# Patient Record
Sex: Female | Born: 1980 | Race: White | Hispanic: No | Marital: Single | Smoking: Current every day smoker
Health system: Southern US, Community
[De-identification: ages and names within clinical notes are randomized; demographics above are authoritative.]

---
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
## 2017-01-27 ENCOUNTER — Ambulatory Visit: Payer: BLUE CROSS/BLUE SHIELD | Admitting: Internal Medicine

## 2017-01-27 ENCOUNTER — Encounter: Payer: Self-pay | Admitting: Internal Medicine

## 2017-01-27 DIAGNOSIS — B182 Chronic viral hepatitis C: Secondary | ICD-10-CM

## 2017-01-27 NOTE — Progress Notes (Signed)
[facility]   CC: consideration for treatment for chronic hepatitis C  HPI:  +Kaitlin Walton is a 36 y.o. female who presents for initial evaluation and management of chronic hepatitis C.  Patient tested positive several years ago and was evaluated for treatment in [facility] but denied by insurance at the time. Hepatitis C-associated risk factors present are: IV drug abuse (details: last used 4 years ago). Patient denies history of blood transfusion, intranasal drug use. Patient has not had other studies performed. Results: none. Patient has not had prior treatment for Hepatitis C. Patient does not have a past history of liver disease. Patient does have a family history of liver disease. Patient does not  have associated signs or symptoms related to liver disease.     Patient does not have documented immunity to Hepatitis A. Patient does not have documented immunity to Hepatitis B.    Review of Systems:  Constitutional: negative for fatigue and malaise Gastrointestinal: negative for diarrhea Integument/breast: negative for rash All other systems reviewed and are negative       No past medical history on file.  Prior to Admission medications   Medication Sig Start Date End Date Taking? Authorizing Provider  [facility] NICOTINE 21 MG/24HR patch APPLY 1 PATCH DAILY AS DIRECTED. 12/20/16   [provider]    Not on File  Social History   Tobacco Use  . Smoking status: Current Every Day Smoker    Packs/day: 0.50    Types: Cigarettes  . Smokeless tobacco: Never Used  Substance Use Topics  . Alcohol use: No    Frequency: Never  . Drug use: No    FMH: grandfather with alcoholic cirrhosis   Objective:  Constitutional: in no apparent distress and alert,  Vitals:   01/27/17 0907  BP: 117/71  Pulse: 60  Temp: 98 F (36.7 C)   Eyes: anicteric Cardiovascular: Cor RRR Respiratory: CTA B; normal respiratory effort Gastrointestinal: Bowel sounds  are normal, liver is not enlarged, spleen is not enlarged Musculoskeletal: no pedal edema noted Skin: negatives: no rash; no porphyria cutanea tarda Lymphatic: no cervical lymphadenopathy   Laboratory Genotype: No results found for: HCVGENOTYPE HCV viral load: No results found for: HCVQUANT No results found for: WBC, HGB, HCT, MCV, PLT No results found for: CREATININE, BUN, NA, K, CL, CO2 No results found for: ALT, AST, GGT, ALKPHOS   Labs and history reviewed and show CHILD-PUGH unknown  5-6 points: Child class A 7-9 points: Child class B 10-15 points: Child class C  No results found for: INR, BILITOT, ALBUMIN   Assessment: New Patient with Chronic Hepatitis C genotype unknown, untreated.  I discussed with the patient the lab findings that confirm chronic hepatitis C as well as the natural history and progression of disease including about 30% of people who develop cirrhosis of the liver if left untreated and once cirrhosis is established there is a 2-7% risk per year of liver cancer and liver failure.  I discussed the importance of treatment and benefits in reducing the risk, even if significant liver fibrosis exists.   Plan: 1) Patient counseled extensively on limiting acetaminophen to no more than 2 grams daily, avoidance of alcohol. 2) Transmission discussed with patient including sexual transmission, sharing razors and toothbrush.   3) Will need referral to gastroenterology if concern for cirrhosis 4) Will need referral for substance abuse counseling: No.; Further work up to include urine drug screen  No. 5) Will prescribe appropriate  medication based on genotype and coverage  6) Hepatitis A and B titers 7) Pneumovax vaccine if indicated next visit 9) Further work up to include liver staging with elastography 10) will follow up to discuss results and decide on treatment

## 2017-01-27 NOTE — Patient Instructions (Signed)
Date 01/27/17  Dear Ms Carriger, As discussed in the ID Clinic, your hepatitis C therapy will include highly effective medication(s) for treatment and will vary based on the type of hepatitis C and insurance approval.  Potential medications include:          Harvoni (sofosbuvir 90mg /ledipasvir 400mg ) tablet oral daily          OR     Epclusa (sofosbuvir 400mg /velpatasvir 100mg ) tablet oral daily          OR      Mavyret (glecaprevir 100 mg/pibrentasvir 40 mg): Take 3 tablets oral daily          OR     Zepatier (elbasvir 50 mg/grazoprevir 100 mg) oral daily, +/- ribavirin              Medications are typically for 8 or 12 weeks total ---------------------------------------------------------------- Your HCV Treatment Start Date: You will be notified by our office once the medication is approved and where you can pick it up (or if mailed)   ---------------------------------------------------------------- YOUR PHARMACY CONTACT:   [facility] [address] [place], [place] [address] Phone: [phone] Hours: Monday to Friday 7:30 am to 6:00 pm   Please always contact your pharmacy at least 3-4 business days before you run out of medications to ensure your next month's medication is ready or 1 week prior to running out if you receive it by mail.  Remember, each prescription is for 28 days. ---------------------------------------------------------------- GENERAL NOTES REGARDING YOUR HEPATITIS C MEDICATION:  Some medications have the following interactions:  - Acid reducing agents such as H2 blockers (ie. Pepcid (famotidine), Zantac (ranitidine), Tagamet (cimetidine), Axid (nizatidine) and proton pump inhibitors (ie. Prilosec (omeprazole), Protonix (pantoprazole), Nexium (esomeprazole), or Aciphex (rabeprazole)). Do not take until you have discussed with a health care provider.    -Antacids that contain magnesium and/or aluminum hydroxide (ie. Milk of Magensia, Rolaids,  Gaviscon, Maalox, Mylanta, an dArthritis Pain Formula).  -Calcium carbonate (calcium supplements or antacids such as Tums, Caltrate, Os-Cal).  -St. John's wort or any products that contain St. John's wort like some herbal supplements  Please inform the office prior to starting any of these medications.  - The common side effects associated with Harvoni include:      1. Fatigue      2. Headache      3. Nausea      4. Diarrhea      5. Insomnia  Please note that this only lists the most common side effects and is NOT a comprehensive list of the potential side effects of these medications. For more information, please review the drug information sheets that come with your medication package from the pharmacy.  ---------------------------------------------------------------- GENERAL HELPFUL HINTS ON HCV THERAPY: 1. Stay well-hydrated. 2. Notify the ID Clinic of any changes in your other over-the-counter/herbal or prescription medications. 3. If you miss a dose of your medication, take the missed dose as soon as you remember. Return to your regular time/dose schedule the next day.  4.  Do not stop taking your medications without first talking with your healthcare provider. 5.  You may take Tylenol (acetaminophen), as long as the dose is less than 2000 mg (OR no more than 4 tablets of the Tylenol Extra Strengths 500mg  tablet) in 24 hours. 6.  You will see our pharmacist-specialist within the first 2 weeks of starting your medication to monitor for any possible side effects. 7.  You will have labs once during treatment, soon  after treatment completion and one final lab 6 months after treatment completion to verify the virus is out of your system.  Scharlene Gloss, [place] for [place], [place]  [phone] [phone]

## 2017-01-28 LAB — HIV ANTIBODY (ROUTINE TESTING W REFLEX): HIV 1&2 Ab, 4th Generation: NONREACTIVE

## 2017-01-28 LAB — COMPLETE METABOLIC PANEL WITH GFR
AG RATIO: 1.5 (calc) (ref 1.0–2.5)
ALT: 36 U/L — ABNORMAL HIGH (ref 6–29)
AST: 24 U/L (ref 10–30)
Albumin: 4.5 g/dL (ref 3.6–5.1)
Alkaline phosphatase (APISO): 38 U/L (ref 33–115)
BUN: 11 mg/dL (ref 7–25)
CO2: 29 mmol/L (ref 20–32)
CREATININE: 0.74 mg/dL (ref 0.50–1.10)
Calcium: 9.6 mg/dL (ref 8.6–10.2)
Chloride: 103 mmol/L (ref 98–110)
GFR, EST AFRICAN AMERICAN: 119 mL/min/{1.73_m2} (ref >=60)
GFR, Est Non African American: 103 mL/min/{1.73_m2} (ref >=60)
GLOBULIN: 3 g/dL (ref 1.9–3.7)
Glucose, Bld: 85 mg/dL (ref 65–99)
POTASSIUM: 4 mmol/L (ref 3.5–5.3)
SODIUM: 139 mmol/L (ref 135–146)
TOTAL PROTEIN: 7.5 g/dL (ref 6.1–8.1)
Total Bilirubin: 0.8 mg/dL (ref 0.2–1.2)

## 2017-01-28 LAB — CBC WITH DIFFERENTIAL/PLATELET
BASOS ABS: 29 {cells}/uL (ref 0–200)
BASOS PCT: 0.7 %
EOS ABS: 118 {cells}/uL (ref 15–500)
Eosinophils Relative: 2.8 %
HCT: 40.3 % (ref 35.0–45.0)
HEMOGLOBIN: 14.1 g/dL (ref 11.7–15.5)
Lymphs Abs: 1852 cells/uL (ref 850–3900)
MCH: 32.9 pg (ref 27.0–33.0)
MCHC: 35 g/dL (ref 32.0–36.0)
MCV: 93.9 fL (ref 80.0–100.0)
MONOS PCT: 4.7 %
MPV: 11.6 fL (ref 7.5–12.5)
NEUTROS ABS: 2003 {cells}/uL (ref 1500–7800)
Neutrophils Relative %: 47.7 %
Platelets: 137 10*3/uL — ABNORMAL LOW (ref 140–400)
RBC: 4.29 10*6/uL (ref 3.80–5.10)
RDW: 11.4 % (ref 11.0–15.0)
Total Lymphocyte: 44.1 %
WBC: 4.2 10*3/uL (ref 3.8–10.8)
WBCMIX: 197 {cells}/uL — AB (ref 200–950)

## 2017-01-28 LAB — HEPATITIS B CORE ANTIBODY, TOTAL: HEP B C TOTAL AB: NONREACTIVE

## 2017-01-28 LAB — HEPATITIS B SURFACE ANTIBODY,QUALITATIVE: HEP B S AB: NONREACTIVE

## 2017-01-28 LAB — PROTIME-INR
INR: 1
PROTHROMBIN TIME: 10.5 s (ref 9.0–11.5)

## 2017-01-28 LAB — HEPATITIS A ANTIBODY, TOTAL: Hepatitis A AB,Total: NONREACTIVE

## 2017-01-28 LAB — HEPATITIS B SURFACE ANTIGEN: HEP B S AG: NONREACTIVE

## 2017-01-31 LAB — LIVER FIBROSIS, FIBROTEST-ACTITEST
ALPHA-2-MACROGLOBULIN: 223 mg/dL (ref 106–279)
ALT: 38 U/L — AB (ref 6–29)
Apolipoprotein A1: 211 mg/dL — ABNORMAL HIGH (ref 101–198)
Bilirubin: 0.7 mg/dL (ref 0.2–1.2)
FIBROSIS SCORE: 0.08
GGT: 11 U/L (ref 3–50)
HAPTOGLOBIN: 100 mg/dL (ref 43–212)
NECROINFLAMMAT ACT SCORE: 0.15
REFERENCE ID: 2229735

## 2017-02-01 ENCOUNTER — Ambulatory Visit (HOSPITAL_COMMUNITY)
Admission: RE | Admit: 2017-02-01 | Discharge: 2017-02-01 | Disposition: A | Discharge status: Home / Self Care | Payer: BLUE CROSS/BLUE SHIELD | Source: Ambulatory Visit | Attending: Internal Medicine | Admitting: Internal Medicine

## 2017-02-01 DIAGNOSIS — K738 Other chronic hepatitis, not elsewhere classified: Secondary | ICD-10-CM | POA: Diagnosis present

## 2017-02-01 DIAGNOSIS — B182 Chronic viral hepatitis C: Secondary | ICD-10-CM | POA: Insufficient documentation

## 2017-02-01 LAB — HEPATITIS C RNA QUANTITATIVE
HCV Quantitative Log: 6.72 Log IU/mL — ABNORMAL HIGH
HCV RNA, PCR, QN: 5280000 [IU]/mL — AB

## 2017-02-01 LAB — HEPATITIS C GENOTYPE

## 2017-02-10 ENCOUNTER — Ambulatory Visit: Payer: BLUE CROSS/BLUE SHIELD | Admitting: Internal Medicine

## 2017-03-14 ENCOUNTER — Encounter: Payer: Self-pay | Admitting: Internal Medicine

## 2017-03-14 ENCOUNTER — Ambulatory Visit: Payer: BLUE CROSS/BLUE SHIELD | Admitting: Internal Medicine

## 2017-03-14 ENCOUNTER — Other Ambulatory Visit: Payer: Self-pay

## 2017-03-14 VITALS — BP 116/76 | HR 66 | Temp 98.3°F | Ht 61.0 in | Wt 126.0 lb

## 2017-03-14 DIAGNOSIS — Z23 Encounter for immunization: Secondary | ICD-10-CM | POA: Diagnosis not present

## 2017-03-14 DIAGNOSIS — K7400 Hepatic fibrosis, unspecified: Secondary | ICD-10-CM | POA: Insufficient documentation

## 2017-03-14 DIAGNOSIS — B182 Chronic viral hepatitis C: Secondary | ICD-10-CM

## 2017-03-14 DIAGNOSIS — Z7189 Other specified counseling: Secondary | ICD-10-CM | POA: Insufficient documentation

## 2017-03-14 DIAGNOSIS — K7460 Unspecified cirrhosis of liver: Secondary | ICD-10-CM | POA: Diagnosis not present

## 2017-03-14 DIAGNOSIS — Z7185 Encounter for immunization safety counseling: Secondary | ICD-10-CM

## 2017-03-14 MED ORDER — LEDIPASVIR-SOFOSBUVIR 90-400 MG PO TABS: 1.0000 | ORAL_TABLET | Freq: Every day | ORAL | 2 refills | Status: DC

## 2017-03-14 NOTE — Addendum Note (Signed)
Addended by: Andree CossHOWELL, Paytin Ramakrishnan M on: 03/14/2017 05:21 PM   Modules accepted: Orders

## 2017-03-14 NOTE — Assessment & Plan Note (Signed)
Counseled on hepatitis A and B and started series today

## 2017-03-14 NOTE — Patient Instructions (Signed)
Date 03/14/17  Dear Ms Royster, As discussed in the ID Clinic, your hepatitis C therapy will include the following medications:          Harvoni 90mg /400mg  tablet:           Take 1 tablet by mouth once daily   Please note that ALL MEDICATIONS WILL START ON THE SAME DATE for a total of 12 weeks. ---------------------------------------------------------------- Your HCV Treatment Start Date: TBA   Your HCV genotype:  1a    Liver Fibrosis: F0/1    ---------------------------------------------------------------- YOUR PHARMACY CONTACT:   [facility] [address] [place], [place] [address] Phone: [phone] Hours: Monday to Friday 7:30 am to 6:00 pm   Please always contact your pharmacy at least 3-4 business days before you run out of medications to ensure your next month's medication is ready or 1 week prior to running out if you receive it by mail.  Remember, each prescription is for 28 days. ---------------------------------------------------------------- GENERAL NOTES REGARDING YOUR HEPATITIS C MEDICATION:  SOFOSBUVIR/LEDIPASVIR (HARVONI): - Harvoni tablet is taken daily with OR without food. - The tablets are orange. - The tablets should be stored at room temperature.  - Acid reducing agents such as H2 blockers (ie. Pepcid (famotidine), Zantac (ranitidine), Tagamet (cimetidine), Axid (nizatidine) and proton pump inhibitors (ie. Prilosec (omeprazole), Protonix (pantoprazole), Nexium (esomeprazole), or Aciphex (rabeprazole)) can decrease effectiveness of Harvoni. Do not take until you have discussed with a health care provider.    -Antacids that contain magnesium and/or aluminum hydroxide (ie. Milk of Magensia, Rolaids, Gaviscon, Maalox, Mylanta, an dArthritis Pain Formula)can reduce absorption of Harvoni, so take them at least 4 hours before or after Harvoni.  -Calcium carbonate (calcium supplements or antacids such as Tums, Caltrate, Os-Cal)needs to be taken  at least 4 hours hours before or after Harvoni.  -St. John's wort or any products that contain St. John's wort like some herbal supplements  Please inform the office prior to starting any of these medications.  - The common side effects associated with Harvoni include:      1. Fatigue      2. Headache      3. Nausea      4. Diarrhea      5. Insomnia  Please note that this only lists the most common side effects and is NOT a comprehensive list of the potential side effects of these medications. For more information, please review the drug information sheets that come with your medication package from the pharmacy.  ---------------------------------------------------------------- GENERAL HELPFUL HINTS ON HCV THERAPY: 1. Stay well-hydrated. 2. Notify the ID Clinic of any changes in your other over-the-counter/herbal or prescription medications. 3. If you miss a dose of your medication, take the missed dose as soon as you remember. Return to your regular time/dose schedule the next day.  4.  Do not stop taking your medications without first talking with your healthcare provider. 5.  You may take Tylenol (acetaminophen), as long as the dose is less than 2000 mg (OR no more than 4 tablets of the Tylenol Extra Strengths 500mg  tablet) in 24 hours. 6.  You will see our pharmacist-specialist within the first 2 weeks of starting your medication to monitor for any possible side effects. 7.  You will have labs once during treatment, after soon after treatment completion and one final lab 6 months after treatment completion to verify the virus is out of your system.  Gardiner Barefootobert W Courtlyn Aki, MD  [facility] [address] [place], [place]  [address] [phone]

## 2017-03-14 NOTE — Assessment & Plan Note (Signed)
Will start her on Harvoni once approved.  She will continue to follow up with PharmD.

## 2017-03-14 NOTE — Progress Notes (Signed)
   Subjective:    Patient ID: Kaitlin Walton, female    DOB: 11/30/1980, 37 y.o.   MRN: [id number]  HPI Here for follow up of chronic hepatitis C. Has genotype 1a, viral load 5.2 million.  Elastography F0/1 and Fibrosure F0.  Not taking any medications.  Hepatitis A and B non-immune.  No associated fatigue.    Review of Systems  Gastrointestinal: Negative for diarrhea.  Skin: Negative for rash.  Neurological: Negative for dizziness.       Objective:   Physical Exam  Constitutional: She appears well-developed and well-nourished. No distress.  HENT:  Mouth/Throat: No oropharyngeal exudate.  Eyes: No scleral icterus.  Cardiovascular: Normal rate, regular rhythm and normal heart sounds.  No murmur heard. Pulmonary/Chest: Effort normal and breath sounds normal. No respiratory distress.  Skin: No rash noted.   SH: no alcohol       Assessment & Plan:

## 2017-03-14 NOTE — Assessment & Plan Note (Signed)
minmal fibrosis.  No follow up needed, no HCC screening indicated

## 2017-03-17 ENCOUNTER — Other Ambulatory Visit: Payer: Self-pay | Admitting: Pharmacist Clinician (PhC)/ Clinical Pharmacy Specialist

## 2017-03-17 MED ORDER — LEDIPASVIR-SOFOSBUVIR 90-400 MG PO TABS: 1.0000 | ORAL_TABLET | Freq: Every day | ORAL | 2 refills | Status: DC

## 2017-03-17 NOTE — Progress Notes (Signed)
Has to be tx to [facility]

## 2017-03-22 ENCOUNTER — Encounter: Payer: Self-pay | Admitting: Pharmacy Technician

## 2017-04-18 ENCOUNTER — Ambulatory Visit (INDEPENDENT_AMBULATORY_CARE_PROVIDER_SITE_OTHER): Payer: BLUE CROSS/BLUE SHIELD | Admitting: Pharmacist Clinician (PhC)/ Clinical Pharmacy Specialist

## 2017-04-18 DIAGNOSIS — Z23 Encounter for immunization: Secondary | ICD-10-CM | POA: Diagnosis not present

## 2017-04-18 DIAGNOSIS — B182 Chronic viral hepatitis C: Secondary | ICD-10-CM | POA: Diagnosis not present

## 2017-04-18 LAB — COMPLETE METABOLIC PANEL WITH GFR
AG Ratio: 1.7 (calc) (ref 1.0–2.5)
ALKALINE PHOSPHATASE (APISO): 40 U/L (ref 33–115)
ALT: 12 U/L (ref 6–29)
AST: 15 U/L (ref 10–30)
Albumin: 4.5 g/dL (ref 3.6–5.1)
BUN: 9 mg/dL (ref 7–25)
CHLORIDE: 105 mmol/L (ref 98–110)
CO2: 27 mmol/L (ref 20–32)
CREATININE: 0.77 mg/dL (ref 0.50–1.10)
Calcium: 9.3 mg/dL (ref 8.6–10.2)
GFR, Est African American: 114 mL/min/{1.73_m2} (ref >=60)
GFR, Est Non African American: 98 mL/min/{1.73_m2} (ref >=60)
GLUCOSE: 79 mg/dL (ref 65–99)
Globulin: 2.6 g/dL (calc) (ref 1.9–3.7)
Potassium: 4.2 mmol/L (ref 3.5–5.3)
SODIUM: 138 mmol/L (ref 135–146)
Total Bilirubin: 0.6 mg/dL (ref 0.2–1.2)
Total Protein: 7.1 g/dL (ref 6.1–8.1)

## 2017-04-18 NOTE — Progress Notes (Addendum)
HPI: Kaitlin RammingLena M Walton is a 37 y.o. female who is here for her hep C f/u with pharmacy  Lab Results  Component Value Date   HCVGENOTYPE 1a 01/27/2017    Allergies: Allergies  Allergen Reactions  . Penicillins Rash    Childhood allergy per mother     Vitals:    Past Medical History: No past medical history on file.  Social History: Social History   Socioeconomic History  . Marital status: Single    Spouse name: Not on file  . Number of children: Not on file  . Years of education: Not on file  . Highest education level: Not on file  Social Needs  . Financial resource strain: Not on file  . Food insecurity - worry: Not on file  . Food insecurity - inability: Not on file  . Transportation needs - medical: Not on file  . Transportation needs - non-medical: Not on file  Occupational History  . Not on file  Tobacco Use  . Smoking status: Current Every Day Smoker    Packs/day: 0.50    Types: Cigarettes  . Smokeless tobacco: Never Used  . Tobacco comment: have patches, not yet ready  Substance and Sexual Activity  . Alcohol use: No    Frequency: Never  . Drug use: No  . Sexual activity: Yes  Other Topics Concern  . Not on file  Social History Narrative  . Not on file    Labs: Hep B S Ab (no units)  Date Value  01/27/2017 NON-REACTIVE   Hepatitis B Surface Ag (no units)  Date Value  01/27/2017 NON-REACTIVE    Lab Results  Component Value Date   HCVGENOTYPE 1a 01/27/2017    Hepatitis C RNA quantitative Latest Ref Rng & Units 01/27/2017  HCV Quantitative Log NOT DETECT Log IU/mL 6.72(H)    AST (U/L)  Date Value  01/27/2017 24   ALT (U/L)  Date Value  01/27/2017 38 (H)  01/27/2017 36 (H)   INR (no units)  Date Value  01/27/2017 1.0    CrCl: CrCl cannot be calculated (Patient's most recent lab result is older than the maximum 21 days allowed.).  Fibrosis Score: F0/1 as assessed by ARFI  Child-Pugh Score: Class A  Previous Treatment  Regimen: None  Assessment: Kaitlin SalesLena started on Harvoni through [facility] on 1/24. She was only approved for 8 wks due to her fibrosis score, VL, and race. Explained to her that he chance of cure will still be very high. She has not missed a dose but she took one that was late. She normally takes it a night but took that dose the next morning. We will get labs today and see her back next month for the EOT visit.   Will give her the second hep B today. We can give his last hep A and B at the cured visit with pharmacy.   Recommendations:  Continue Harvoni 1 PO qday x 8 wks Hep C VL, CMP today F/u next month for the EOT visit Make SVR12 and cure visit next month Final hep A and B at the cure visit  Perez Dirico, Pharm.D., BCPS, AAHIVP Clinical Infectious Disease Pharmacist [facility] 04/18/2017, 10:59 AM

## 2017-04-20 LAB — HEPATITIS C RNA QUANTITATIVE
HCV QUANT LOG: DETECTED {Log_IU}/mL — AB
HCV RNA, PCR, QN: DETECTED [IU]/mL — AB

## 2017-05-18 ENCOUNTER — Other Ambulatory Visit: Payer: Self-pay | Admitting: Pharmacist

## 2017-05-23 ENCOUNTER — Ambulatory Visit (INDEPENDENT_AMBULATORY_CARE_PROVIDER_SITE_OTHER): Payer: BLUE CROSS/BLUE SHIELD | Admitting: Pharmacist Clinician (PhC)/ Clinical Pharmacy Specialist

## 2017-05-23 DIAGNOSIS — B182 Chronic viral hepatitis C: Secondary | ICD-10-CM

## 2017-05-23 LAB — COMPLETE METABOLIC PANEL WITH GFR
AG RATIO: 1.9 (calc) (ref 1.0–2.5)
ALT: 8 U/L (ref 6–29)
AST: 14 U/L (ref 10–30)
Albumin: 4.6 g/dL (ref 3.6–5.1)
Alkaline phosphatase (APISO): 36 U/L (ref 33–115)
BILIRUBIN TOTAL: 0.8 mg/dL (ref 0.2–1.2)
BUN: 14 mg/dL (ref 7–25)
CO2: 26 mmol/L (ref 20–32)
Calcium: 9.3 mg/dL (ref 8.6–10.2)
Chloride: 104 mmol/L (ref 98–110)
Creat: 0.78 mg/dL (ref 0.50–1.10)
GFR, Est African American: 112 mL/min/{1.73_m2} (ref >=60)
GFR, Est Non African American: 96 mL/min/{1.73_m2} (ref >=60)
GLOBULIN: 2.4 g/dL (ref 1.9–3.7)
Glucose, Bld: 93 mg/dL (ref 65–99)
POTASSIUM: 4.4 mmol/L (ref 3.5–5.3)
SODIUM: 138 mmol/L (ref 135–146)
Total Protein: 7 g/dL (ref 6.1–8.1)

## 2017-05-23 NOTE — Progress Notes (Signed)
HPI: Kaitlin Walton is a 37 y.o. female who is here for her EOT visit with pharmacy for hep C.   Lab Results  Component Value Date   HCVGENOTYPE 1a 01/27/2017    Allergies: Allergies  Allergen Reactions  . Penicillins Rash    Childhood allergy per mother     Vitals:    Past Medical History: No past medical history on file.  Social History: Social History   Socioeconomic History  . Marital status: Single    Spouse name: Not on file  . Number of children: Not on file  . Years of education: Not on file  . Highest education level: Not on file  Occupational History  . Not on file  Social Needs  . Financial resource strain: Not on file  . Food insecurity:    Worry: Not on file    Inability: Not on file  . Transportation needs:    Medical: Not on file    Non-medical: Not on file  Tobacco Use  . Smoking status: Current Every Day Smoker    Packs/day: 0.50    Types: Cigarettes  . Smokeless tobacco: Never Used  . Tobacco comment: have patches, not yet ready  Substance and Sexual Activity  . Alcohol use: No    Frequency: Never  . Drug use: No  . Sexual activity: Yes  Lifestyle  . Physical activity:    Days per week: Not on file    Minutes per session: Not on file  . Stress: Not on file  Relationships  . Social connections:    Talks on phone: Not on file    Gets together: Not on file    Attends religious service: Not on file    Active member of club or organization: Not on file    Attends meetings of clubs or organizations: Not on file    Relationship status: Not on file  Other Topics Concern  . Not on file  Social History Narrative  . Not on file    Labs: Hep B S Ab (no units)  Date Value  01/27/2017 NON-REACTIVE   Hepatitis B Surface Ag (no units)  Date Value  01/27/2017 NON-REACTIVE    Lab Results  Component Value Date   HCVGENOTYPE 1a 01/27/2017    Hepatitis C RNA quantitative Latest Ref Rng & Units 04/18/2017 01/27/2017  HCV Quantitative Log  NOT DETECT Log IU/mL <1.18 DETECTED(A) 6.72(H)    AST (U/L)  Date Value  04/18/2017 15  01/27/2017 24   ALT (U/L)  Date Value  04/18/2017 12  01/27/2017 38 (H)  01/27/2017 36 (H)   INR (no units)  Date Value  01/27/2017 1.0    CrCl: CrCl cannot be calculated (Patient's most recent lab result is older than the maximum 21 days allowed.).  Fibrosis Score: F0/1 as assessed by ARFI  Child-Pugh Score: Class A  Previous Treatment Regimen: None  Assessment: Kaitlin Walton finished her 8 wks of Harvoni last week so she is here today for her EOT visit for labs. She took on dose late. Her last VL was <15. Showed her the results. She will come back in 3 months for the [facility] then the cure visit with pharmacy. She will get her final hep A and B vaccine at that visit.   She has stayed clean on IV drugs. Said that she will not go back to it. Advised her that she could be re-infected if she goes back to IV drugs. Encourage her to stop smoking.  Recommendations:  Hep C VL and CMP today SVR12 in 3 months and cure with pharmacy Final hep A/B at cure visit  Minh Pham, Pharm.D., BCPS, AAHIVP Clinical Infectious Disease Pharmacist [facility] 05/23/2017, 10:49 AM

## 2017-05-26 LAB — HEPATITIS C RNA QUANTITATIVE
HCV Quantitative Log: <1.18 Log IU/mL
HCV RNA, PCR, QN: NOT DETECTED [IU]/mL

## 2017-08-24 ENCOUNTER — Other Ambulatory Visit: Payer: BLUE CROSS/BLUE SHIELD

## 2017-08-25 ENCOUNTER — Other Ambulatory Visit: Payer: BLUE CROSS/BLUE SHIELD

## 2017-08-25 DIAGNOSIS — B182 Chronic viral hepatitis C: Secondary | ICD-10-CM

## 2017-08-25 LAB — COMPLETE METABOLIC PANEL WITH GFR
AG Ratio: 1.9 (calc) (ref 1.0–2.5)
ALKALINE PHOSPHATASE (APISO): 32 U/L — AB (ref 33–115)
ALT: 6 U/L (ref 6–29)
AST: 12 U/L (ref 10–30)
Albumin: 4.4 g/dL (ref 3.6–5.1)
BUN: 9 mg/dL (ref 7–25)
CO2: 28 mmol/L (ref 20–32)
CREATININE: 0.81 mg/dL (ref 0.50–1.10)
Calcium: 9.4 mg/dL (ref 8.6–10.2)
Chloride: 103 mmol/L (ref 98–110)
GFR, Est African American: 107 mL/min/{1.73_m2} (ref >=60)
GFR, Est Non African American: 92 mL/min/{1.73_m2} (ref >=60)
GLOBULIN: 2.3 g/dL (ref 1.9–3.7)
GLUCOSE: 88 mg/dL (ref 65–99)
Potassium: 4.5 mmol/L (ref 3.5–5.3)
SODIUM: 138 mmol/L (ref 135–146)
Total Bilirubin: 0.9 mg/dL (ref 0.2–1.2)
Total Protein: 6.7 g/dL (ref 6.1–8.1)

## 2017-08-27 LAB — HEPATITIS C RNA QUANTITATIVE
HCV QUANT LOG: NOT DETECTED {Log_IU}/mL
HCV RNA, PCR, QN: <15 IU/mL

## 2017-08-29 ENCOUNTER — Ambulatory Visit: Payer: BLUE CROSS/BLUE SHIELD

## 2017-08-31 ENCOUNTER — Other Ambulatory Visit: Payer: Self-pay | Admitting: Pharmacist Clinician (PhC)/ Clinical Pharmacy Specialist

## 2017-08-31 NOTE — Progress Notes (Signed)
Kaitlin Walton missed the cure visit with us the other day. She is now cured of hep C. She was excited to hear the news. However, she still needs to finish out her last hep A and B vaccines. She will come in for nurse visit on Monday.

## 2017-09-05 ENCOUNTER — Ambulatory Visit (INDEPENDENT_AMBULATORY_CARE_PROVIDER_SITE_OTHER): Payer: BLUE CROSS/BLUE SHIELD | Admitting: *Deleted

## 2017-09-05 DIAGNOSIS — B182 Chronic viral hepatitis C: Secondary | ICD-10-CM

## 2017-09-05 DIAGNOSIS — Z23 Encounter for immunization: Secondary | ICD-10-CM | POA: Diagnosis not present

## 2018-02-07 IMAGING — US US ABDOMEN COMPLETE W/ ELASTOGRAPHY
1 series · 12 of 12 positions shown · non-contrast
Comparison: None.

CLINICAL DATA: Hepatitis-C



[Series 1: us abdomen complete w/ elastography · 0.17mm/px · 12 of 12 slices shown]
[im 1/12]
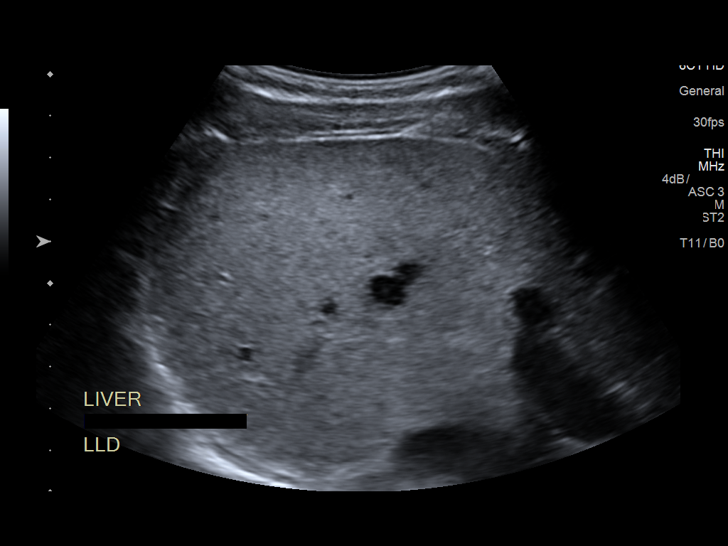
[im 2/12]
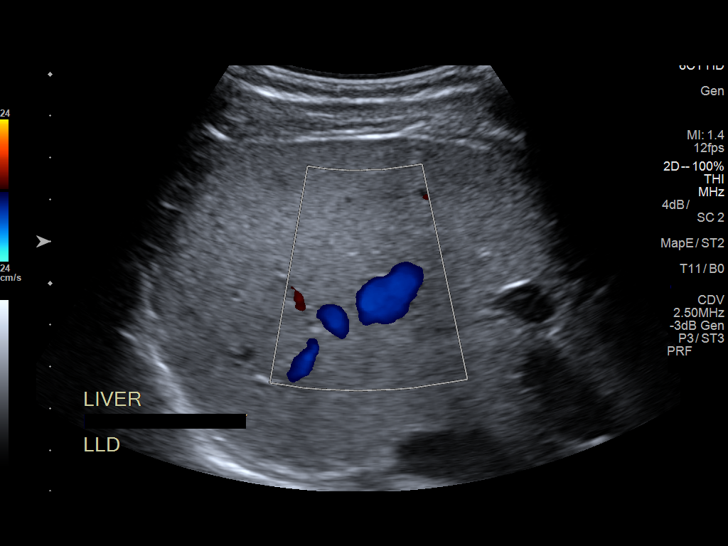
[im 3/12]
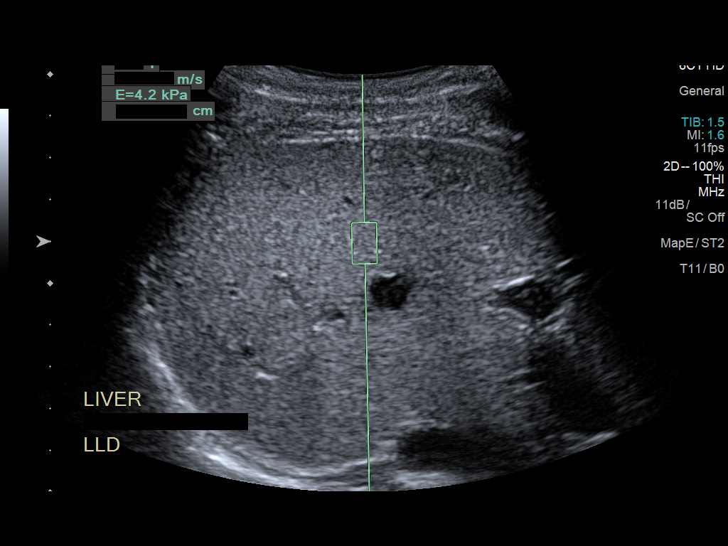
[im 4/12]
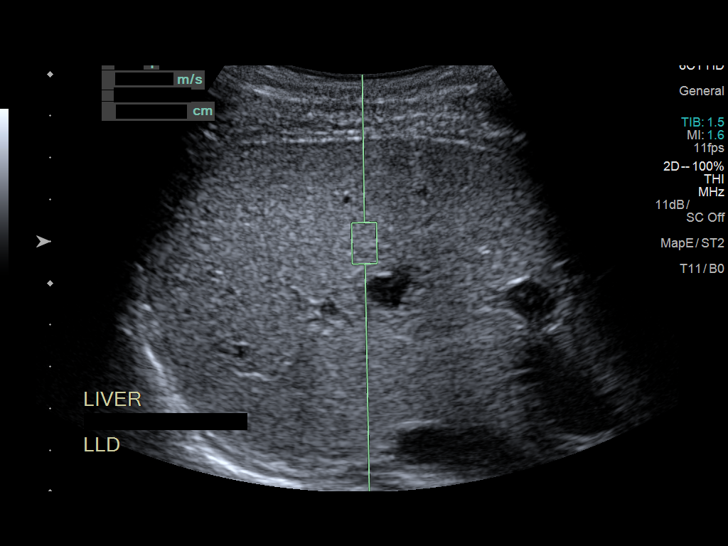
[im 5/12]
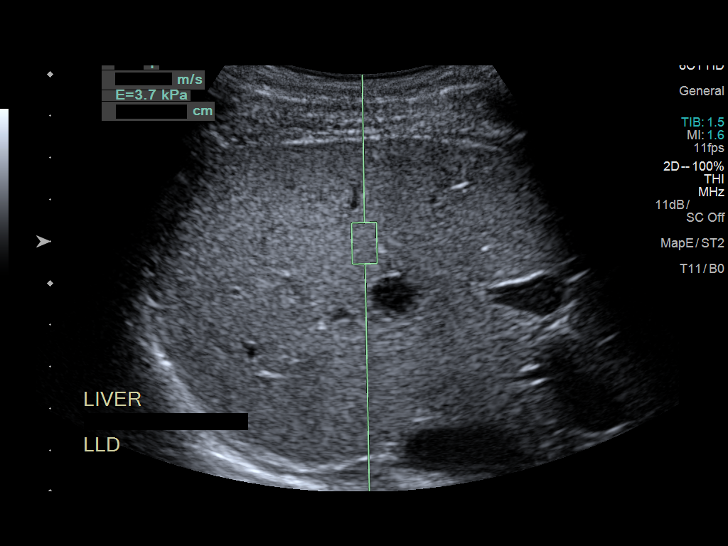
[im 6/12]
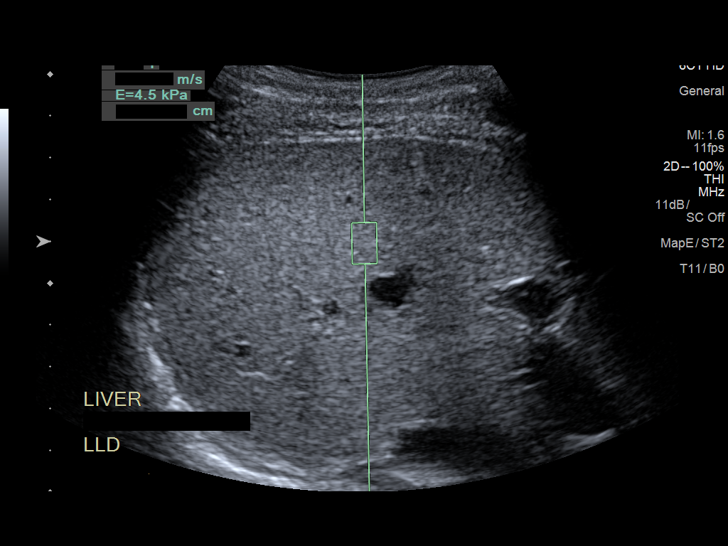
[im 7/12]
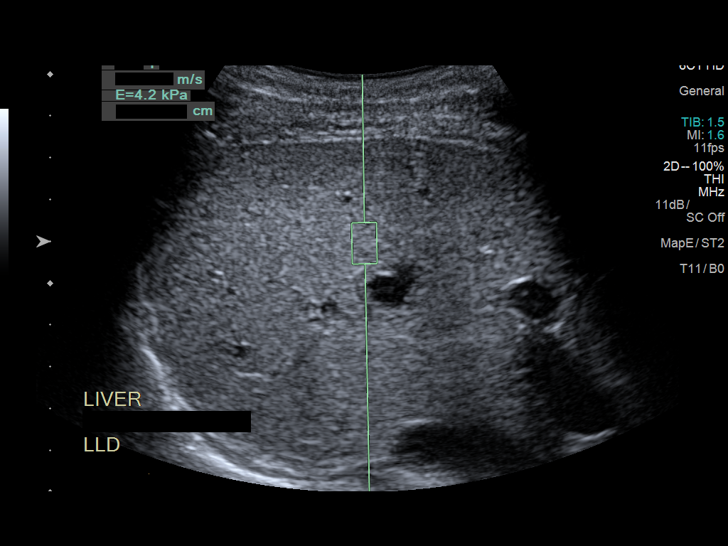
[im 8/12]
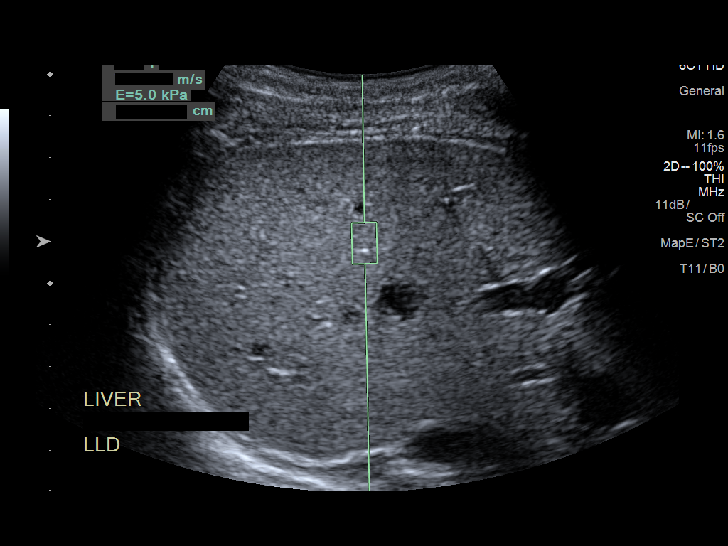
[im 9/12]
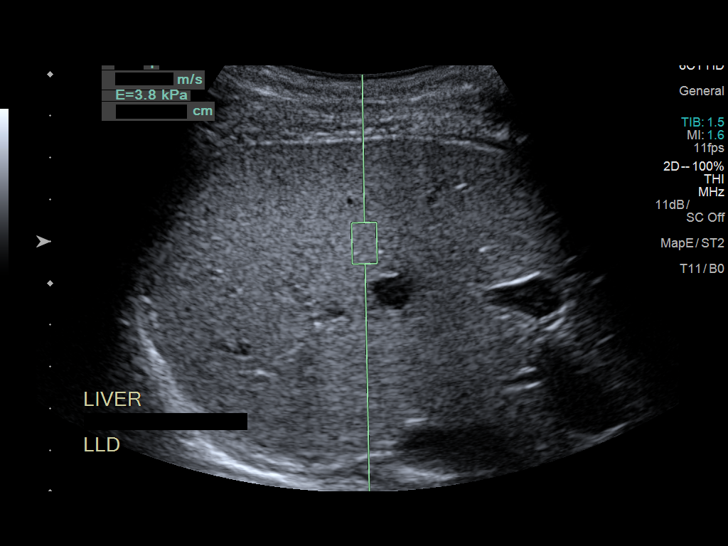
[im 10/12]
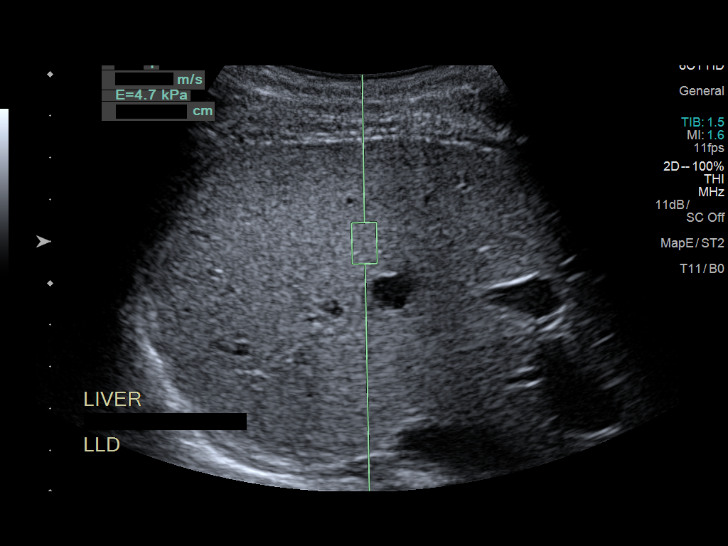
[im 11/12]
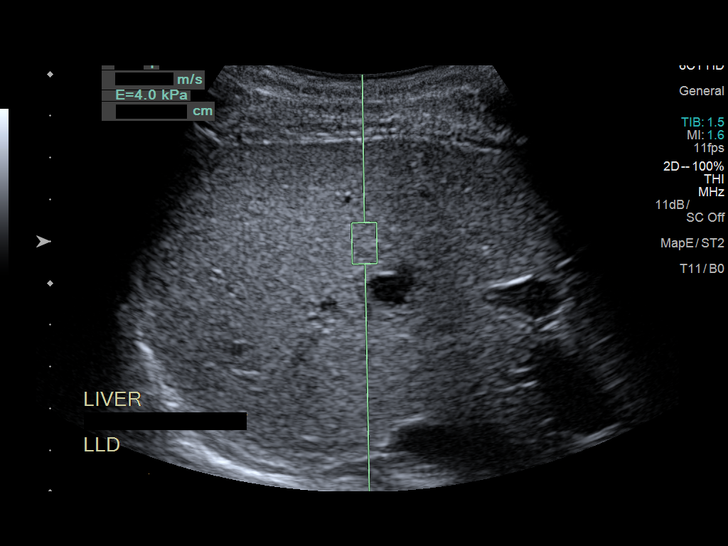
[im 12/12]
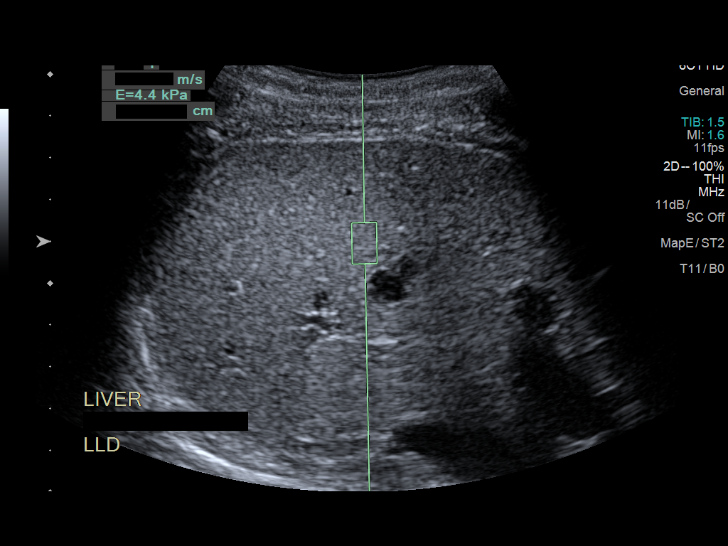

[12 of 12 positions shown; findings below may reference images not displayed]

FINDINGS: ULTRASOUND ABDOMEN

Gallbladder: No gallstones or wall thickening visualized. No
sonographic Murphy sign noted by sonographer.

Common bile duct: Diameter: Normal caliber, 3 mm.

Liver: No focal lesion identified. Within normal limits in
parenchymal echogenicity. Portal vein is patent on color Doppler
imaging with normal direction of blood flow towards the liver.

IVC: No abnormality visualized.

Pancreas: Visualized portion unremarkable.

Spleen: Size and appearance within normal limits.

Right Kidney: Length: 11.1 cm. Echogenicity within normal limits. No
mass or hydronephrosis visualized.

Left Kidney: Length: 0.0 cm. Echogenicity within normal limits. No
mass or hydronephrosis visualized.

Abdominal aorta: No aneurysm visualized.

Other findings: None.

ULTRASOUND HEPATIC ELASTOGRAPHY

Device: Siemens Helix VTQ

Patient position: Left lateral decubitus

Transducer 6C1

Number of measurements: 10

Hepatic segment:  8

Median velocity:   1.18  m/sec

IQR:

IQR/Median velocity ratio:

Corresponding Metavir fibrosis score:  F0/F1

Risk of fibrosis: Minimal

Limitations of exam: None

Pertinent findings noted on other imaging exams:  None

Please note that abnormal shear wave velocities may also be
identified in clinical settings other than with hepatic fibrosis,
such as: acute hepatitis, elevated right heart and central venous
pressures including use of beta blockers, Proborini disease
(Gudiel), infiltrative processes such as
mastocytosis/amyloidosis/infiltrative tumor, extrahepatic
cholestasis, in the post-prandial state, and liver transplantation.
Correlation with patient history, laboratory data, and clinical
condition recommended.
IMPRESSION: ULTRASOUND ABDOMEN:
Unremarkable abdominal ultrasound.

ULTRASOUND HEPATIC ELASTOGRAPHY:

Median hepatic shear wave velocity is calculated at 1.18 m/sec.

Corresponding Metavir fibrosis score is  F0/F1.

Risk of fibrosis is Minimal.

Follow-up: None required

## 2023-07-28 ENCOUNTER — Ambulatory Visit (HOSPITAL_BASED_OUTPATIENT_CLINIC_OR_DEPARTMENT_OTHER)
Admission: RE | Admit: 2023-07-28 | Discharge: 2023-07-28 | Disposition: A | Discharge status: Home / Self Care | Source: Ambulatory Visit | Attending: Family Medicine | Admitting: Family Medicine

## 2023-07-28 ENCOUNTER — Encounter (HOSPITAL_BASED_OUTPATIENT_CLINIC_OR_DEPARTMENT_OTHER): Payer: Self-pay

## 2023-07-28 VITALS — BP 164/98 | HR 79 | Temp 98.4°F | Resp 20

## 2023-07-28 DIAGNOSIS — N750 Cyst of Bartholin's gland: Secondary | ICD-10-CM | POA: Diagnosis present

## 2023-07-28 NOTE — ED Provider Notes (Signed)
 [facility]    CSN: [id number] Arrival date & time: 07/28/23  1156      History   Chief Complaint Chief Complaint  Patient presents with   Abscess    In vaginal area, possible exposure to std?? - Entered by patient    HPI Kaitlin Walton is a 43 y.o. female.   Patient is a 43 year old female presents today with abscess to vaginal area.  Noticed this earlier this week.  Was doing some warm soaks to the area and the abscess ruptured last night and has some drainage.  Reports that the drainage had a foul odor.  Since she has had decreased pain and pressure in the area and feeling better.  She has had some itching in the area.  No specific vaginal discharge.  Concern would like to be checked for STDs based on recent sexual activity.  Denies any trouble with urination, urinary symptoms, abdominal pain, back pain, fevers   Abscess   History reviewed. No pertinent past medical history.  Patient Active Problem List   Diagnosis Date Noted   Liver fibrosis 03/14/2017   Vaccine counseling 03/14/2017   Chronic hepatitis C without hepatic coma (HCC) 01/27/2017    History reviewed. No pertinent surgical history.  OB History   No obstetric history on file.      Home Medications    Prior to Admission medications   Medication Sig Start Date End Date Taking? Authorizing Provider  [facility] NICOTINE 21 MG/24HR patch APPLY 1 PATCH DAILY AS DIRECTED. 12/20/16   [provider]    Family History History reviewed. No pertinent family history.  Social History Social History   Tobacco Use   Smoking status: Every Day    Current packs/day: 0.50    Types: Cigarettes   Smokeless tobacco: Never   Tobacco comments:    have patches, not yet ready  Substance Use Topics   Alcohol use: No   Drug use: No     Allergies   Penicillins   Review of Systems Review of Systems See HPI  Physical Exam Triage Vital Signs ED Triage Vitals  Encounter Vitals Group     BP  07/28/23 1214 (!) 164/98     Systolic BP Percentile --      Diastolic BP Percentile --      Pulse Rate 07/28/23 1214 79     Resp 07/28/23 1214 20     Temp 07/28/23 1214 98.4 F (36.9 C)     Temp Source 07/28/23 1214 Oral     SpO2 07/28/23 1214 98 %     Weight --      Height --      Head Circumference --      Peak Flow --      Pain Score 07/28/23 1216 2     Pain Loc --      Pain Education --      Exclude from Growth Chart --    No data found.  Updated Vital Signs BP (!) 164/98 (BP Location: Right Arm)   Pulse 79   Temp 98.4 F (36.9 C) (Oral)   Resp 20   LMP 07/23/2023   SpO2 98%   Visual Acuity Right Eye Distance:   Left Eye Distance:   Bilateral Distance:    Right Eye Near:   Left Eye Near:    Bilateral Near:     Physical Exam Genitourinary:    Pubic Area: No rash.        Comments:  No obvious discharge. Exam normal other than Bartholin cyst.       UC Treatments / Results  Labs (all labs ordered are listed, but only abnormal results are displayed) Labs Reviewed  CERVICOVAGINAL ANCILLARY ONLY    EKG   Radiology No results found.  Procedures Procedures (including critical care time)  Medications Ordered in UC Medications - No data to display  Initial Impression / Assessment and Plan / UC Course  I have reviewed the triage vital signs and the nursing notes.  Pertinent labs & imaging results that were available during my care of the patient were reviewed by me and considered in my medical decision making (see chart for details).     Bartholin cyst-appears that this is improving.  The area opened up and drained last night and she is feeling better.  She is just here today for check and wanting STD screening.  Can continue the warm soaks to the area to promote more drainage Swab sent for STD screening. No other concerns on exam.  Follow-up as needed Recommend follow-up with an OB/GYN for any continued issues0 Final Clinical Impressions(s) / UC  Diagnoses   Final diagnoses:  Bartholin cyst     Discharge Instructions      I believe that this is a Bartholin cyst.  Continue to do Epsom salt soaks or warm soaks to the area. We have sent the swab for testing and we will call you with any positive results of this. Recommend establishing with an OB/GYN  ED Prescriptions   None    PDMP not reviewed this encounter.   Landa Pine, FNP 07/28/23 1319

## 2023-07-28 NOTE — ED Triage Notes (Signed)
 Reports earlier in the week, noticed a raised area to right side of inner vaginal wall. Reports it got larger and then it ruptured. States drainage had a foul odor. Would like swab for possible std testing. Declining blood draw for testing. States mild pain now, + discharge, and itching as well.

## 2023-07-28 NOTE — Discharge Instructions (Signed)
 I believe that this is a Bartholin cyst.  Continue to do Epsom salt soaks or warm soaks to the area. We have sent the swab for testing and we will call you with any positive results of this. Recommend establishing with an OB/GYN

## 2023-07-29 LAB — CERVICOVAGINAL ANCILLARY ONLY
Bacterial Vaginitis (gardnerella): POSITIVE — AB
Candida Glabrata: NEGATIVE
Candida Vaginitis: NEGATIVE
Chlamydia: NEGATIVE
Comment: NEGATIVE
Comment: NEGATIVE
Comment: NEGATIVE
Comment: NEGATIVE
Comment: NEGATIVE
Comment: NORMAL
Neisseria Gonorrhea: NEGATIVE
Trichomonas: NEGATIVE

## 2023-07-30 ENCOUNTER — Ambulatory Visit (HOSPITAL_COMMUNITY): Payer: Self-pay

## 2023-07-30 MED ORDER — METRONIDAZOLE 500 MG PO TABS: 500.0000 mg | ORAL_TABLET | Freq: Two times a day (BID) | ORAL | 0 refills | Status: AC
